# Patient Record
Sex: Male | Born: 1995 | Race: Black or African American | Hispanic: No | Marital: Single | Smoking: Current some day smoker
Health system: Southern US, Community
[De-identification: ages and names within clinical notes are randomized; demographics above are authoritative.]

## PROBLEM LIST (undated history)

## (undated) DIAGNOSIS — M419 Scoliosis, unspecified: Secondary | ICD-10-CM

## (undated) HISTORY — PX: HERNIA REPAIR: SHX51

---
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
## 2016-07-19 IMAGING — CT CT HEAD W/O CM
2 series · 17 of 30 positions shown, 20 images · non-contrast
Comparison: None.

CLINICAL DATA: Frontal headache, nausea

EXAM:
CT HEAD WITHOUT CONTRAST
TECHNIQUE: Contiguous axial images were obtained from the base of the skull
through the vertex without intravenous contrast.

[Series 2: head w/o · axial · non-contrast · 0.45mm/px · z∈[+689,+804]mm · 9 of 29 slices shown, 12 images]
[im 3/29  brain]
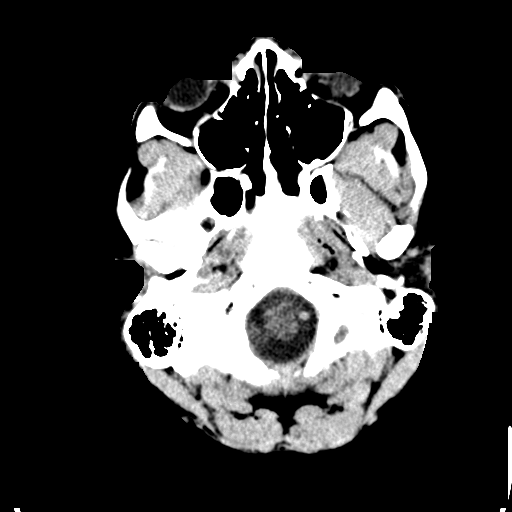
[im 3/29  bone]
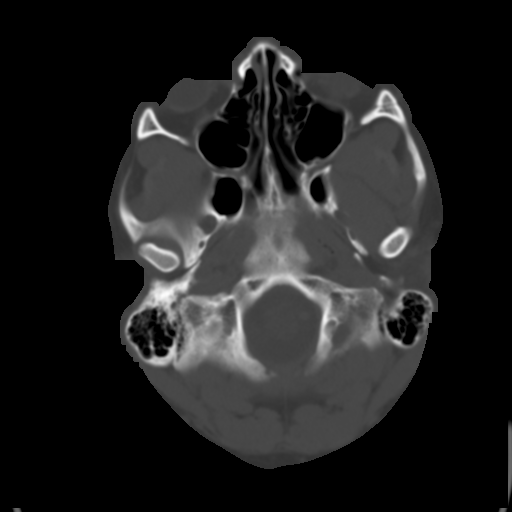
[im 6/29  brain]
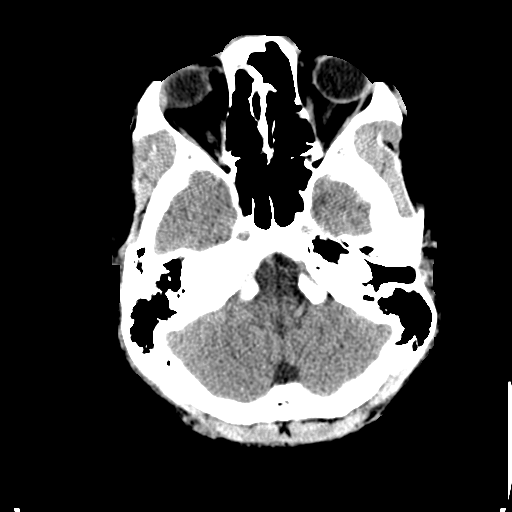
[im 9/29  brain]
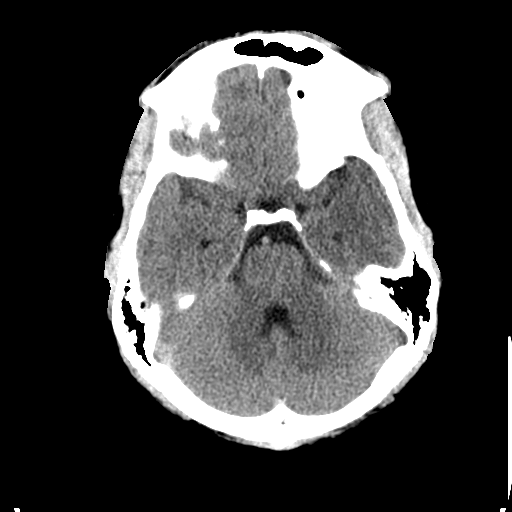
[im 12/29  brain]
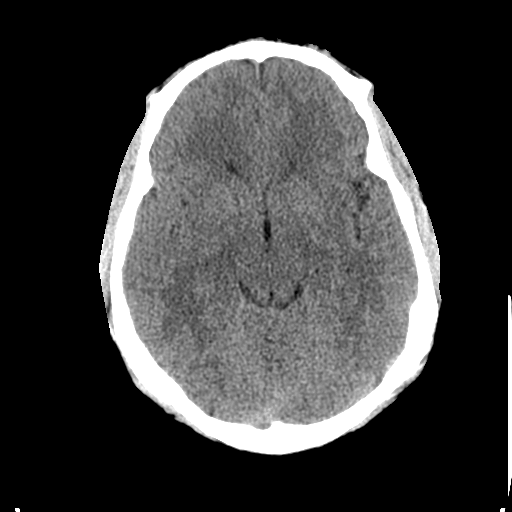
[im 15/29  brain]
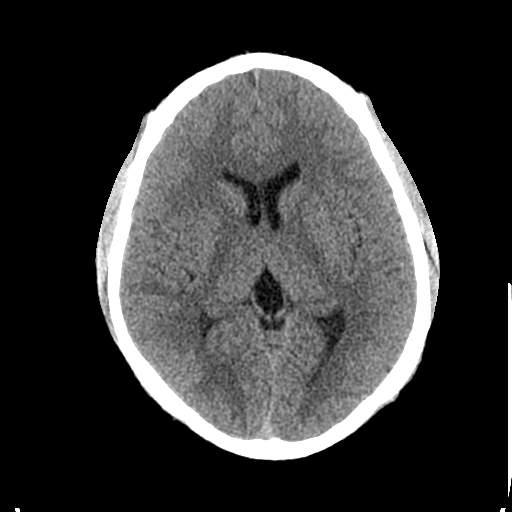
[im 15/29  bone]
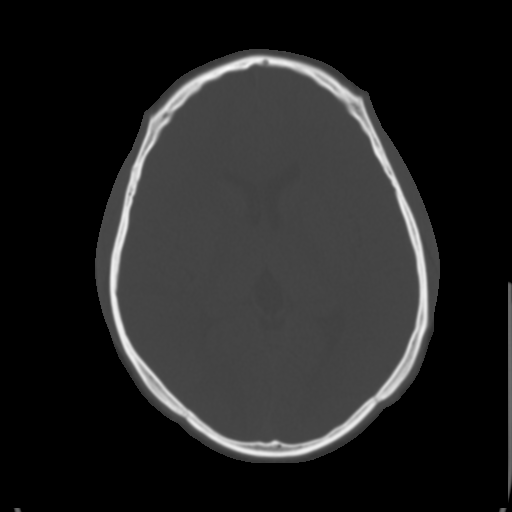
[im 17/29  brain]
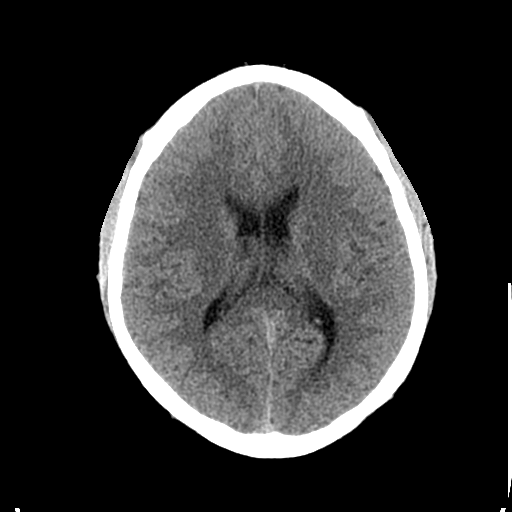
[im 20/29  brain]
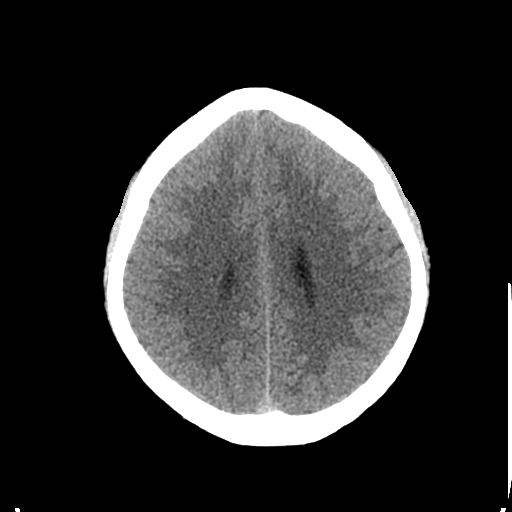
[im 23/29  brain]
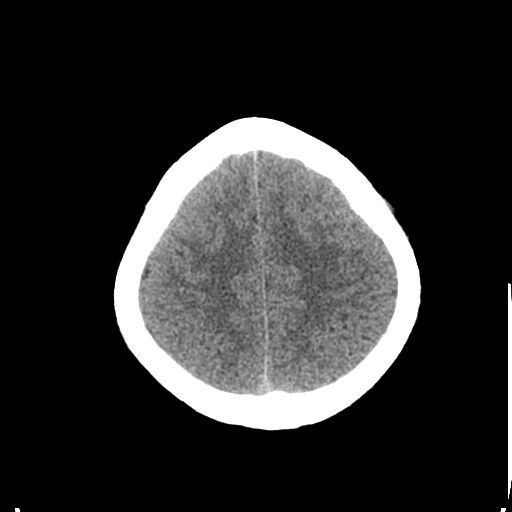
[im 26/29  brain]
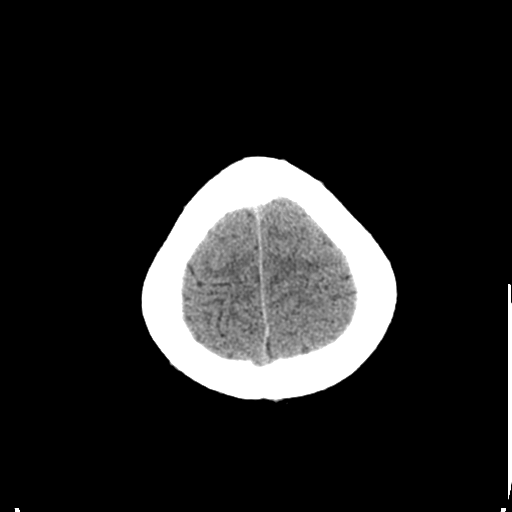
[im 26/29  bone]
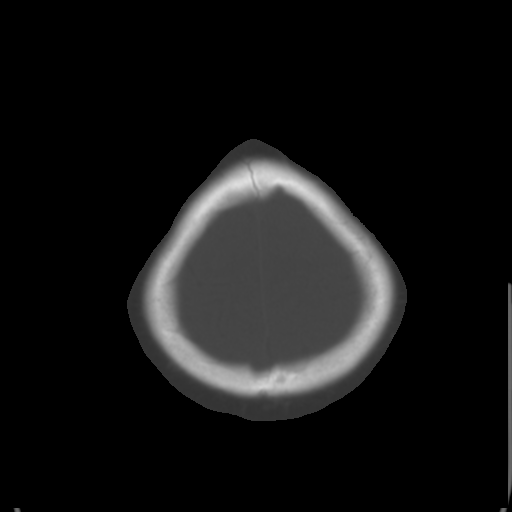

[Series 3: bone windows · axial · 0.45mm/px · z∈[+694,+805]mm · 8 of 49 slices shown]
[im 6/49  bone]
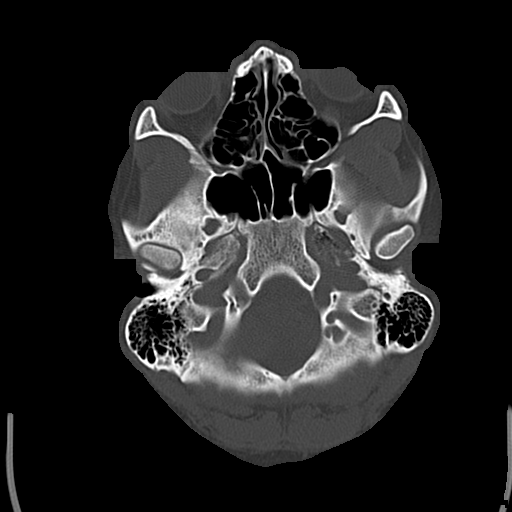
[im 11/49  bone]
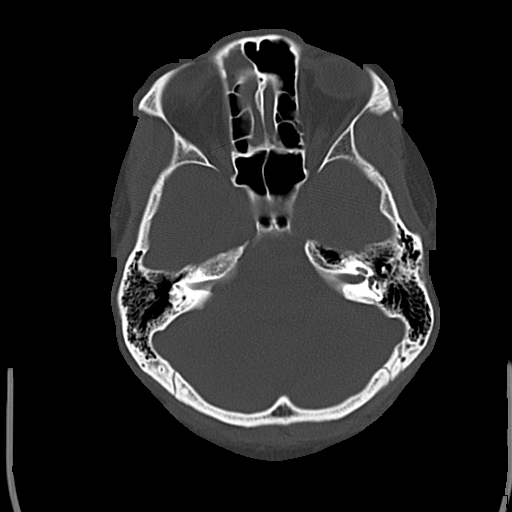
[im 17/49  bone]
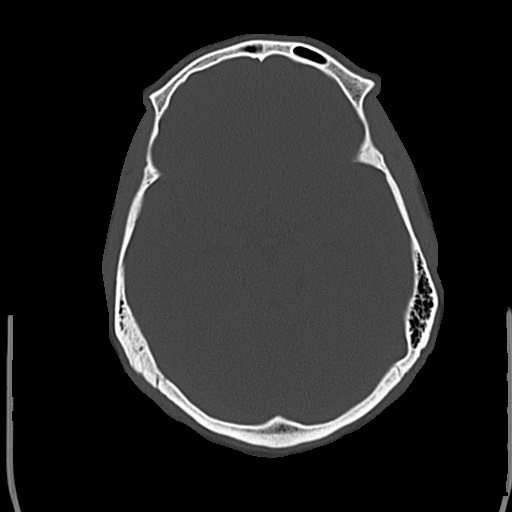
[im 22/49  bone]
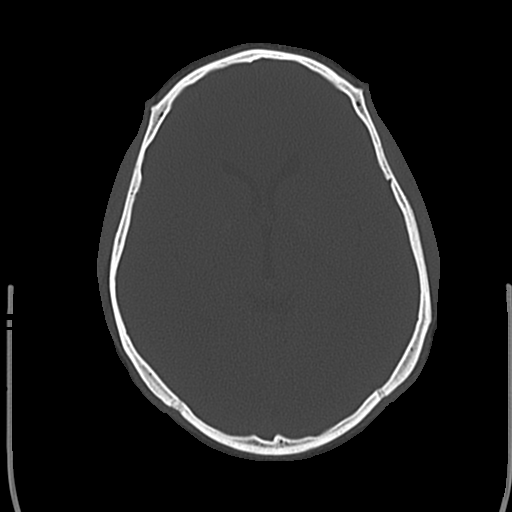
[im 27/49  bone]
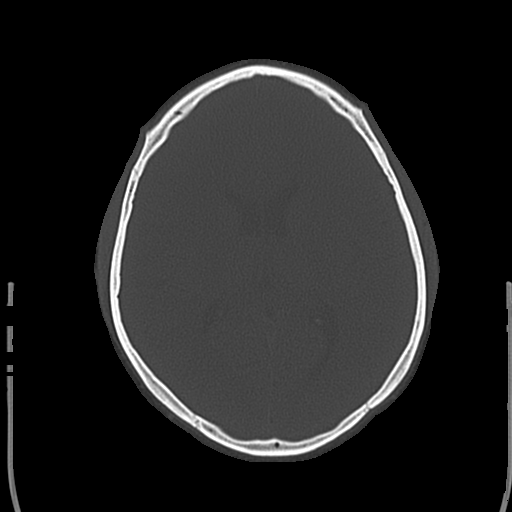
[im 33/49  bone]
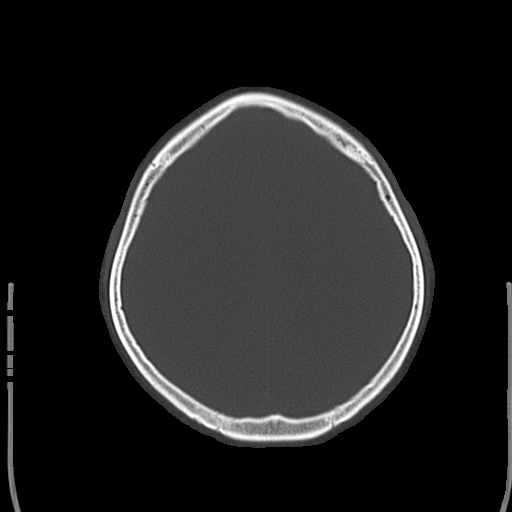
[im 38/49  bone]
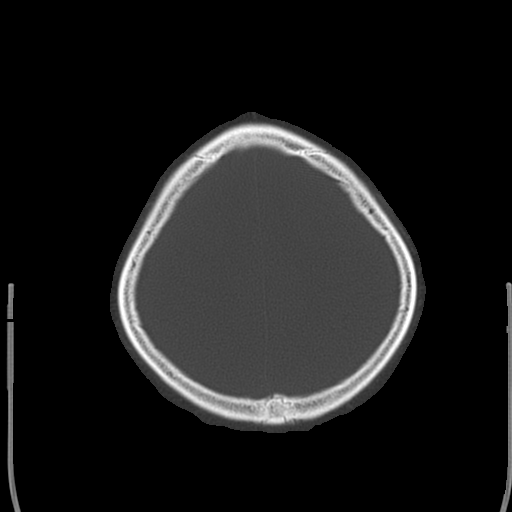
[im 43/49  bone]
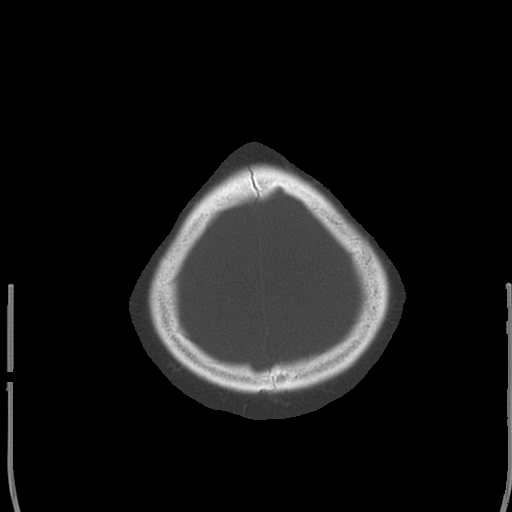

[17 of 30 positions shown; findings below may reference images not displayed]

FINDINGS: No evidence of parenchymal hemorrhage or extra-axial fluid
collection. No mass lesion, mass effect, or midline shift.

No CT evidence of acute infarction.

Cerebral volume is within normal limits.  No ventriculomegaly.

Partial opacification of the right frontal sinus. Visualized
paranasal sinuses are otherwise clear. The mastoid air cells are
unopacified.

No evidence of calvarial fracture.
IMPRESSION: Normal head CT.

## 2018-07-12 ENCOUNTER — Encounter: Payer: Self-pay | Admitting: Internal Medicine

## 2020-06-10 ENCOUNTER — Emergency Department (HOSPITAL_COMMUNITY): Payer: Self-pay

## 2020-06-10 ENCOUNTER — Encounter (HOSPITAL_COMMUNITY): Payer: Self-pay | Admitting: *Deleted

## 2020-06-10 ENCOUNTER — Emergency Department (HOSPITAL_COMMUNITY)
Admission: EM | Admit: 2020-06-10 | Discharge: 2020-06-10 | Disposition: A | Discharge status: Home / Self Care | Payer: Self-pay | Attending: Emergency Medicine | Admitting: Emergency Medicine

## 2020-06-10 ENCOUNTER — Other Ambulatory Visit: Payer: Self-pay

## 2020-06-10 DIAGNOSIS — Z7951 Long term (current) use of inhaled steroids: Secondary | ICD-10-CM | POA: Insufficient documentation

## 2020-06-10 DIAGNOSIS — S6291XA Unspecified fracture of right wrist and hand, initial encounter for closed fracture: Secondary | ICD-10-CM

## 2020-06-10 DIAGNOSIS — Z23 Encounter for immunization: Secondary | ICD-10-CM | POA: Insufficient documentation

## 2020-06-10 DIAGNOSIS — M79641 Pain in right hand: Secondary | ICD-10-CM | POA: Insufficient documentation

## 2020-06-10 DIAGNOSIS — J45909 Unspecified asthma, uncomplicated: Secondary | ICD-10-CM | POA: Insufficient documentation

## 2020-06-10 MED ORDER — TETANUS-DIPHTH-ACELL PERTUSSIS 5-2.5-18.5 LF-MCG/0.5 IM SUSP
0.5000 mL | Freq: Once | INTRAMUSCULAR | Status: AC
  Administered 2020-06-10: 0.5 mL via INTRAMUSCULAR
  Filled 2020-06-10: qty 0.5

## 2020-06-10 NOTE — Progress Notes (Signed)
Orthopedic Tech Progress Note Patient Details:  Mitchell Wilson 01-23-1996 [id number]  Ortho Devices Type of Ortho Device: Ace wrap, Ulna gutter splint Ortho Device/Splint Location: RUE Ortho Device/Splint Interventions: Ordered, Application   Post Interventions Patient Tolerated: Well Instructions Provided: Care of device   [name] 06/10/2020, 11:05 PM
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
## 2020-07-19 ENCOUNTER — Encounter (HOSPITAL_COMMUNITY): Payer: Self-pay

## 2020-07-19 ENCOUNTER — Emergency Department (HOSPITAL_COMMUNITY): Payer: Self-pay

## 2020-07-19 ENCOUNTER — Emergency Department (HOSPITAL_COMMUNITY)
Admission: EM | Admit: 2020-07-19 | Discharge: 2020-07-20 | Disposition: A | Discharge status: Home / Self Care | Payer: Self-pay | Attending: Emergency Medicine | Admitting: Emergency Medicine

## 2020-07-19 DIAGNOSIS — Y9389 Activity, other specified: Secondary | ICD-10-CM | POA: Insufficient documentation

## 2020-07-19 DIAGNOSIS — Y9259 Other trade areas as the place of occurrence of the external cause: Secondary | ICD-10-CM | POA: Insufficient documentation

## 2020-07-19 DIAGNOSIS — W228XXA Striking against or struck by other objects, initial encounter: Secondary | ICD-10-CM | POA: Insufficient documentation

## 2020-07-19 DIAGNOSIS — Z5321 Procedure and treatment not carried out due to patient leaving prior to being seen by health care provider: Secondary | ICD-10-CM | POA: Insufficient documentation

## 2020-07-19 DIAGNOSIS — S61411A Laceration without foreign body of right hand, initial encounter: Secondary | ICD-10-CM | POA: Insufficient documentation

## 2020-07-19 DIAGNOSIS — S61412A Laceration without foreign body of left hand, initial encounter: Secondary | ICD-10-CM | POA: Insufficient documentation
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
## 2020-11-22 ENCOUNTER — Ambulatory Visit (HOSPITAL_COMMUNITY)
Admission: EM | Admit: 2020-11-22 | Discharge: 2020-11-22 | Disposition: A | Discharge status: Home / Self Care | Payer: No Payment, Other | Attending: Psychiatry | Admitting: Psychiatry

## 2020-11-22 ENCOUNTER — Other Ambulatory Visit: Payer: Self-pay

## 2020-11-22 DIAGNOSIS — Z733 Stress, not elsewhere classified: Secondary | ICD-10-CM | POA: Diagnosis not present

## 2020-11-22 DIAGNOSIS — R455 Hostility: Secondary | ICD-10-CM | POA: Diagnosis not present

## 2020-11-22 DIAGNOSIS — F39 Unspecified mood [affective] disorder: Secondary | ICD-10-CM | POA: Insufficient documentation

## 2020-11-22 DIAGNOSIS — Z653 Problems related to other legal circumstances: Secondary | ICD-10-CM | POA: Insufficient documentation

## 2020-11-22 DIAGNOSIS — Z818 Family history of other mental and behavioral disorders: Secondary | ICD-10-CM | POA: Insufficient documentation

## 2020-11-22 DIAGNOSIS — J45909 Unspecified asthma, uncomplicated: Secondary | ICD-10-CM | POA: Insufficient documentation

## 2020-11-22 DIAGNOSIS — Z560 Unemployment, unspecified: Secondary | ICD-10-CM | POA: Diagnosis not present

## 2020-11-22 LAB — COMPREHENSIVE METABOLIC PANEL
ALT: 19 U/L (ref 0–44)
AST: 24 U/L (ref 15–41)
Albumin: 4.2 g/dL (ref 3.5–5.0)
Alkaline Phosphatase: 65 U/L (ref 38–126)
Anion gap: 11 (ref 5–15)
BUN: 7 mg/dL (ref 6–20)
CO2: 27 mmol/L (ref 22–32)
Calcium: 9.6 mg/dL (ref 8.9–10.3)
Chloride: 102 mmol/L (ref 98–111)
Creatinine, Ser: 1.05 mg/dL (ref 0.61–1.24)
GFR, Estimated: >60 mL/min (ref >60)
Glucose, Bld: 92 mg/dL (ref 70–99)
Potassium: 4 mmol/L (ref 3.5–5.1)
Sodium: 140 mmol/L (ref 135–145)
Total Bilirubin: 0.8 mg/dL (ref 0.3–1.2)
Total Protein: 7.7 g/dL (ref 6.5–8.1)

## 2020-11-22 LAB — CBC WITH DIFFERENTIAL/PLATELET
Abs Immature Granulocytes: 0.03 10*3/uL (ref 0.00–0.07)
Basophils Absolute: 0 10*3/uL (ref 0.0–0.1)
Basophils Relative: 1 %
Eosinophils Absolute: 0.1 10*3/uL (ref 0.0–0.5)
Eosinophils Relative: 2 %
HCT: 43.6 % (ref 39.0–52.0)
Hemoglobin: 13.8 g/dL (ref 13.0–17.0)
Immature Granulocytes: 1 %
Lymphocytes Relative: 37 %
Lymphs Abs: 2.1 10*3/uL (ref 0.7–4.0)
MCH: 27 pg (ref 26.0–34.0)
MCHC: 31.7 g/dL (ref 30.0–36.0)
MCV: 85.3 fL (ref 80.0–100.0)
Monocytes Absolute: 0.4 10*3/uL (ref 0.1–1.0)
Monocytes Relative: 8 %
Neutro Abs: 3 10*3/uL (ref 1.7–7.7)
Neutrophils Relative %: 51 %
Platelets: 212 10*3/uL (ref 150–400)
RBC: 5.11 MIL/uL (ref 4.22–5.81)
RDW: 14 % (ref 11.5–15.5)
WBC: 5.7 10*3/uL (ref 4.0–10.5)
nRBC: 0 % (ref 0.0–0.2)

## 2020-11-22 LAB — LIPID PANEL
Cholesterol: 205 mg/dL — ABNORMAL HIGH (ref 0–200)
HDL: 38 mg/dL — ABNORMAL LOW (ref >40)
LDL Cholesterol: 153 mg/dL — ABNORMAL HIGH (ref 0–99)
Total CHOL/HDL Ratio: 5.4 RATIO
Triglycerides: 71 mg/dL (ref <150)
VLDL: 14 mg/dL (ref 0–40)

## 2020-11-22 LAB — TSH: TSH: 1.382 u[IU]/mL (ref 0.350–4.500)

## 2020-11-22 LAB — HEMOGLOBIN A1C
Hgb A1c MFr Bld: 5.8 % — ABNORMAL HIGH (ref 4.8–5.6)
Mean Plasma Glucose: 119.76 mg/dL

## 2020-11-22 MED ORDER — QUETIAPINE FUMARATE 100 MG PO TABS: 100.0000 mg | ORAL_TABLET | Freq: Every day | ORAL | 1 refills | Status: DC

## 2020-11-22 MED ORDER — OXCARBAZEPINE 150 MG PO TABS: 150.0000 mg | ORAL_TABLET | Freq: Two times a day (BID) | ORAL | 1 refills | Status: DC

## 2020-11-22 NOTE — ED Provider Notes (Signed)
[facility] Medical Screening Exam  Patient Name: Mitchell Wilson MRN: [id number] Date of Evaluation: 11/22/20 Chief Complaint:   Diagnosis:  Final diagnoses:  Mood disorder (HCC)    History of Present illness: Mitchell Wilson is a 25 y.o. male.  Patient resents voluntarily as a walk-in to the [facility] accompanied by his mother. Mother is present  present for the evaluation.  Patient reports that he lives with his mother and he is unemployed and not in school.  He reports that he has aggressive behavior and that he does use marijuana.  He reports that his aggressive behavior gets out of control and that his moods are up and down and feels paranoid sometimes.  He reports that he is currently in mental health court and has 9 charges pending against him for various assault charges.  Patient denies any suicidal or homicidal ideations and denies any hallucinations.  He reports that the biggest reason he came in is he is wanting to receive follow-up treatment and to get started on some medications for his aggression.  Patient's mother reports that she is diagnosed with schizophrenia and they are concerned for his mental health.  She states that her daughter also has mental health issues but does not have a diagnosis yet.  She reports there were scheduled to see a provider at the [facility] for outpatient on 12/30/2020 but feels that his aggression has continued to worsen and that the judge about the mental health court recommended that he come here to be evaluated before that appointment.  Patient denies any medical issues except for having asthma and that he uses a inhaler as needed.  Patient denies having any other medical problems and is agreeable to have labs drawn today.  He also reports he has difficulty with sleeping.  After discussing medication options for the complaints agreed to start the patient on Seroquel 100 mg p.o. nightly and Trileptal 150 mg p.o. twice daily appropriate labs were  ordered.  Patient and patient's mother feel that he is safe for discharge home with the medications and knowing that have a follow-up appointment on 12/30/2020.  Is also recommended to return for open access for walk-in at the [facility]  Psychiatric Specialty Exam  Presentation  General Appearance:Appropriate for Environment; Casual; Well Groomed  Eye Contact:Good  Speech:Clear and Coherent; Normal Rate  Speech Volume:Normal  Handedness:Right   Mood and Affect  Mood:Euthymic  Affect:Appropriate; Congruent   Thought Process  Thought Processes:Coherent  Descriptions of Associations:Intact  Orientation:Full (Time, Place and Person)  Thought Content:WDL  Hallucinations:None  Ideas of Reference:Paranoia  Suicidal Thoughts:No  Homicidal Thoughts:No data recorded  Sensorium  Memory:Immediate Good; Recent Good; Remote Good  Judgment:Intact  Insight:Fair   Executive Functions  Concentration:Good  Attention Span:Good  Recall:Good  Fund of Knowledge:Good  Language:Good   Psychomotor Activity  Psychomotor Activity:Normal   Assets  Assets:Communication Skills; Desire for Improvement; Housing; Social Support; Transportation; Physical Health   Sleep  Sleep:Fair  Number of hours: No data recorded  Physical Exam: Physical Exam Vitals and nursing note reviewed.  Constitutional:      Appearance: He is well-developed.  HENT:     Head: Normocephalic.  Eyes:     Pupils: Pupils are equal, round, and reactive to light.  Cardiovascular:     Rate and Rhythm: Normal rate.  Pulmonary:     Effort: Pulmonary effort is normal.  Musculoskeletal:        General: Normal range of motion.  Neurological:  Mental Status: He is alert and oriented to person, place, and time.    Review of Systems  Constitutional: Negative.   HENT: Negative.   Eyes: Negative.   Respiratory: Negative.   Cardiovascular: Negative.   Gastrointestinal: Negative.   Genitourinary:  Negative.   Musculoskeletal: Negative.   Skin: Negative.   Neurological: Negative.   Endo/Heme/Allergies: Negative.   Psychiatric/Behavioral: Negative.    Blood pressure 128/82, pulse 80, temperature 98 F (36.7 C), temperature source Oral, resp. rate 18, SpO2 100 %. There is no height or weight on file to calculate BMI.  Musculoskeletal: Strength & Muscle Tone: within normal limits Gait & Station: normal Patient leans: N/A   [facility] MSE Discharge Disposition for Follow up and Recommendations: Based on my evaluation the patient does not appear to have an emergency medical condition and can be discharged with resources and follow up care in outpatient services for Medication Management and Individual Therapy   Maryfrances Bunnell, FNP 11/22/2020, 10:21 AM
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
## 2020-11-22 NOTE — Discharge Instructions (Signed)
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
## 2020-11-22 NOTE — Discharge Summary (Signed)
[name] to be D/C'd home per MD order. Discussed with the patient and all questions fully answered. An After Visit Summary was printed and given to the patient. Prescriptions were also given to patient. Patient escorted out and D/C home via private auto.  Dickie La  11/22/2020 10:56 AM

## 2020-12-02 ENCOUNTER — Telehealth (HOSPITAL_COMMUNITY): Payer: Self-pay | Admitting: General Practice

## 2020-12-02 NOTE — Telephone Encounter (Signed)
Care Management - Follow Up [facility] Discharges   Writer attempted to make contact with patient today and was unsuccessful.  Writer was able to leave a HIPPA compliant voice message and will await callback.  Per chart review, patient has a follow up appointment on 12-30-2020.

## 2020-12-30 ENCOUNTER — Other Ambulatory Visit: Payer: Self-pay

## 2020-12-30 ENCOUNTER — Other Ambulatory Visit (HOSPITAL_COMMUNITY): Payer: Self-pay | Admitting: Psychiatry

## 2020-12-30 ENCOUNTER — Ambulatory Visit (HOSPITAL_COMMUNITY): Payer: No Payment, Other | Admitting: Clinical

## 2020-12-30 ENCOUNTER — Other Ambulatory Visit: Payer: Self-pay | Admitting: Psychiatry

## 2020-12-30 ENCOUNTER — Ambulatory Visit (INDEPENDENT_AMBULATORY_CARE_PROVIDER_SITE_OTHER): Payer: No Payment, Other | Admitting: Psychiatry

## 2020-12-30 ENCOUNTER — Encounter (HOSPITAL_COMMUNITY): Payer: Self-pay | Admitting: Psychiatry

## 2020-12-30 DIAGNOSIS — F17200 Nicotine dependence, unspecified, uncomplicated: Secondary | ICD-10-CM

## 2020-12-30 DIAGNOSIS — F3130 Bipolar disorder, current episode depressed, mild or moderate severity, unspecified: Secondary | ICD-10-CM | POA: Diagnosis not present

## 2020-12-30 DIAGNOSIS — F411 Generalized anxiety disorder: Secondary | ICD-10-CM | POA: Diagnosis not present

## 2020-12-30 MED ORDER — HYDROXYZINE HCL 10 MG PO TABS: 10.0000 mg | ORAL_TABLET | Freq: Three times a day (TID) | ORAL | 2 refills | Status: DC | PRN

## 2020-12-30 MED ORDER — QUETIAPINE FUMARATE 100 MG PO TABS: 100.0000 mg | ORAL_TABLET | Freq: Every day | ORAL | 1 refills | Status: DC

## 2020-12-30 MED ORDER — BUPROPION HCL ER (XL) 150 MG PO TB24: 150.0000 mg | ORAL_TABLET | ORAL | 2 refills | Status: AC

## 2020-12-30 MED ORDER — OXCARBAZEPINE 150 MG PO TABS: 150.0000 mg | ORAL_TABLET | Freq: Two times a day (BID) | ORAL | 2 refills | Status: AC

## 2020-12-30 MED ORDER — BUPROPION HCL ER (XL) 150 MG PO TB24: 150.0000 mg | ORAL_TABLET | ORAL | 2 refills | Status: DC

## 2020-12-30 MED ORDER — OXCARBAZEPINE 150 MG PO TABS: 150.0000 mg | ORAL_TABLET | Freq: Two times a day (BID) | ORAL | 2 refills | Status: DC

## 2020-12-30 NOTE — Progress Notes (Signed)
Psychiatric Initial Adult Assessment   Patient Identification: Mitchell Wilson MRN:  [id number] Date of Evaluation:  12/30/2020 Referral Source: [facility] Chief Complaint:  "I get angry and anxious. I get triggered easily" Visit Diagnosis:    ICD-10-CM   1. Bipolar I disorder, most recent episode depressed (HCC)  F31.30 QUEtiapine (SEROQUEL) 100 MG tablet    OXcarbazepine (TRILEPTAL) 150 MG tablet    buPROPion (WELLBUTRIN XL) 150 MG 24 hr tablet  2. Generalized anxiety disorder  F41.1 hydrOXYzine (ATARAX/VISTARIL) 10 MG tablet  3. Tobacco use disorder  F17.200 buPROPion (WELLBUTRIN XL) 150 MG 24 hr tablet    History of Present Illness: 25 year old male seen today for initial psychiatric evaluation.  He was referred to outpatient psychiatry by [facility] for medication management.  He has a psychiatric history of ADHD and bipolar disorder.  He is currently being managed on Trileptal 150 mg twice daily and Seroquel 100 mg nightly.  He notes his medications are somewhat effective in managing his psychiatric conditions.  Today he is well-groomed, pleasant, cooperative, engaged in conversation, and maintained eye contact.  He informed provider that he becomes angry and anxious when triggered by disrespectful people.  He notes that this happens often and reports that he snaps on others.  He endorses symptoms of hypomania such as distractibility, fluctuations in mood, racing thoughts, grandiosity (noting at times he has a God complex), and irritability.  Patient notes that when he is irritated he becomes anxious.  Provider conducted a GAD-7 and patient scored a 13.  He notes that to cope with his anxiety he smokes a pack of cigarettes a day but notes that he would like to stop.  He notes that since February he has been sober from marijuana and alcohol.  Provider also conducted a PHQ-9 and patient scored a 15.  He endorses depressed mood, psychomotor agitation, feelings of worthlessness, poor concentration,  fluctuations in weight and fluctuations in appetite.  He endorses adequate sleep while on Seroquel.  Today he denies SI/HI/VAH or paranoia.  Patient informed provider that he has 9 pending court cases due to his mental health conditions.  He informed Clinical research associate that he attends regular classes that will help reduce some of his charges.  He notes that he dislikes the program however is committed to completing it.  Patient notes that he exercises at the gym daily to help manage his stress.  He notes that exercising also helps his mental health.  Today he is agreeable to starting Wellbutrin 150 mg daily to help manage symptoms of depression.  He will also start hydroxyzine 10 mg 3 times daily as needed for anxiety. Potential side effects of medication and risks vs benefits of treatment vs non-treatment were explained and discussed. All questions were answered. He will continue all other medications as prescribed.  No other concerns noted at this time.  Patient notes that Associated Signs/Symptoms: Depression Symptoms:  depressed mood, psychomotor agitation, fatigue, feelings of worthlessness/guilt, difficulty concentrating, hopelessness, impaired memory, anxiety, increased appetite, decreased appetite, (Hypo) Manic Symptoms:  Distractibility, Elevated Mood, Flight of Ideas, Grandiosity, Irritable Mood, Anxiety Symptoms:  Excessive Worry, Psychotic Symptoms:  Denies PTSD Symptoms: NA  Past Psychiatric History: ADHD, Bipolar disorder,  Previous Psychotropic Medications: Seroquel, Trileptal  Substance Abuse History in the last 12 months:  Yes.    Consequences of Substance Abuse: Legal Consequences:  Has 9 pending court cases  Past Medical History:  Past Medical History:  Diagnosis Date  . Asthma   . Scoliosis  Past Surgical History:  Procedure Laterality Date  . HERNIA REPAIR      Family Psychiatric History: Mother schizophrenia and bipolar, Brother Bipolar disorder, 2 sisters  Bipolar disorder  Family History: History reviewed. No pertinent family history.  Social History:   Social History   Socioeconomic History  . Marital status: Single    Spouse name: Not on file  . Number of children: Not on file  . Years of education: Not on file  . Highest education level: Not on file  Occupational History  . Not on file  Tobacco Use  . Smoking status: Never Smoker  . Smokeless tobacco: Not on file  Substance and Sexual Activity  . Alcohol use: No  . Drug use: No  . Sexual activity: Not on file  Other Topics Concern  . Not on file  Social History Narrative  . Not on file   Social Determinants of Health   Financial Resource Strain: Not on file  Food Insecurity: Not on file  Transportation Needs: Not on file  Physical Activity: Not on file  Stress: Not on file  Social Connections: Not on file    Additional Social History: Patient resides in [place] with his parents. She is single and has no children. Current he is unemployed. He endorses smoking a pack of cigarettes a day. He notes that he has been sober from marijuana and alcohol for two months  Allergies:  No Known Allergies  Metabolic Disorder Labs: Lab Results  Component Value Date   HGBA1C 5.8 (H) 11/22/2020   MPG 119.76 11/22/2020   No results found for: PROLACTIN Lab Results  Component Value Date   CHOL 205 (H) 11/22/2020   TRIG 71 11/22/2020   HDL 38 (L) 11/22/2020   CHOLHDL 5.4 11/22/2020   VLDL 14 11/22/2020   LDLCALC 153 (H) 11/22/2020   Lab Results  Component Value Date   TSH 1.382 11/22/2020    Therapeutic Level Labs: No results found for: LITHIUM No results found for: CBMZ No results found for: VALPROATE  Current Medications: Current Outpatient Medications  Medication Sig Dispense Refill  . buPROPion (WELLBUTRIN XL) 150 MG 24 hr tablet Take 1 tablet (150 mg total) by mouth every morning. 30 tablet 2  . hydrOXYzine (ATARAX/VISTARIL) 10 MG tablet Take 1 tablet (10 mg  total) by mouth 3 (three) times daily as needed. 90 tablet 2  . acetaminophen (TYLENOL) 325 MG tablet Take 650 mg by mouth every 6 (six) hours as needed.      Marland Kitchen albuterol (PROVENTIL HFA;VENTOLIN HFA) 108 (90 BASE) MCG/ACT inhaler Inhale 1 puff into the lungs every 6 (six) hours as needed for wheezing or shortness of breath.    . ALBUTEROL IN Inhale into the lungs.      . beclomethasone (QVAR) 80 MCG/ACT inhaler Inhale 1 puff into the lungs as needed.      . loratadine (CLARITIN) 10 MG tablet Take 1 tablet (10 mg total) by mouth daily. 14 tablet 0  . naproxen (NAPROSYN) 500 MG tablet Take 1 tablet (500 mg total) by mouth 2 (two) times daily. 30 tablet 0  . OXcarbazepine (TRILEPTAL) 150 MG tablet Take 1 tablet (150 mg total) by mouth 2 (two) times daily. 60 tablet 2  . QUEtiapine (SEROQUEL) 100 MG tablet Take 1 tablet (100 mg total) by mouth at bedtime. 30 tablet 1  . triamcinolone (NASACORT AQ) 55 MCG/ACT AERO nasal inhaler Place 2 sprays into the nose daily. 1 Inhaler 12   No current facility-administered  medications for this visit.    Musculoskeletal: Strength & Muscle Tone: within normal limits Gait & Station: normal Patient leans: N/A  Psychiatric Specialty Exam: Review of Systems  There were no vitals taken for this visit.There is no height or weight on file to calculate BMI.  General Appearance: Well Groomed  Eye Contact:  Good  Speech:  Clear and Coherent and Normal Rate  Volume:  Normal  Mood:  Anxious and Depressed  Affect:  Appropriate and Congruent  Thought Process:  Coherent, Goal Directed and Linear  Orientation:  Full (Time, Place, and Person)  Thought Content:  WDL and Logical  Suicidal Thoughts:  No  Homicidal Thoughts:  No  Memory:  Immediate;   Good Recent;   Good Remote;   Good  Judgement:  Good  Insight:  Good  Psychomotor Activity:  Normal  Concentration:  Concentration: Good and Attention Span: Good  Recall:  Good  Fund of Knowledge:Good  Language: Good   Akathisia:  No  Handed:  Left  AIMS (if indicated):  Not done  Assets:  Communication Skills Desire for Improvement Housing Social Support  ADL's:  Intact  Cognition: WNL  Sleep:  Good   Screenings: GAD-7   Flowsheet Row Office Visit from 12/30/2020 in [facility]  Total GAD-7 Score 13    PHQ2-9   Flowsheet Row Office Visit from 12/30/2020 in [facility]  PHQ-2 Total Score 3  PHQ-9 Total Score 15    Flowsheet Row Office Visit from 12/30/2020 in [facility]  C-SSRS RISK CATEGORY Error: Q7 should not be populated when Q6 is No      Assessment and Plan: Patient endorses symptoms of anxiety, depression, and hypomania.Today he is agreeable to starting Wellbutrin 150 mg daily to help manage symptoms of depression.  He will also start hydroxyzine 10 mg 3 times daily as needed for anxiety.  He will continue all other medications as prescribed.  1. Bipolar I disorder, most recent episode depressed (HCC)  Continue- QUEtiapine (SEROQUEL) 100 MG tablet; Take 1 tablet (100 mg total) by mouth at bedtime.  Dispense: 30 tablet; Refill: 1 Continue- OXcarbazepine (TRILEPTAL) 150 MG tablet; Take 1 tablet (150 mg total) by mouth 2 (two) times daily.  Dispense: 60 tablet; Refill: 2 Start- buPROPion (WELLBUTRIN XL) 150 MG 24 hr tablet; Take 1 tablet (150 mg total) by mouth every morning.  Dispense: 30 tablet; Refill: 2  2. Generalized anxiety disorder  Start- hydrOXYzine (ATARAX/VISTARIL) 10 MG tablet; Take 1 tablet (10 mg total) by mouth 3 (three) times daily as needed.  Dispense: 90 tablet; Refill: 2  3. Tobacco use disorder  Start- buPROPion (WELLBUTRIN XL) 150 MG 24 hr tablet; Take 1 tablet (150 mg total) by mouth every morning.  Dispense: 30 tablet; Refill: 2  Follow-up in 3 months   Shanna Cisco, NP 3/31/202210:17 AM

## 2021-01-01 ENCOUNTER — Other Ambulatory Visit: Payer: Self-pay

## 2021-01-01 MED FILL — Bupropion HCl Tab ER 24HR 150 MG: ORAL | 30 days supply | Qty: 30 | Fill #0 | Status: AC

## 2021-01-01 MED FILL — Quetiapine Fumarate Tab 100 MG: ORAL | 30 days supply | Qty: 30 | Fill #0 | Status: AC

## 2021-01-01 MED FILL — Hydroxyzine HCl Tab 10 MG: ORAL | 30 days supply | Qty: 90 | Fill #0 | Status: AC

## 2021-01-01 MED FILL — Oxcarbazepine Tab 150 MG: ORAL | 30 days supply | Qty: 60 | Fill #0 | Status: AC

## 2021-01-02 ENCOUNTER — Other Ambulatory Visit: Payer: Self-pay

## 2021-01-03 ENCOUNTER — Other Ambulatory Visit: Payer: Self-pay

## 2021-01-04 ENCOUNTER — Other Ambulatory Visit: Payer: Self-pay

## 2021-03-24 ENCOUNTER — Telehealth (HOSPITAL_COMMUNITY): Payer: No Payment, Other | Admitting: Psychiatry

## 2021-05-12 ENCOUNTER — Other Ambulatory Visit: Payer: Self-pay
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
## 2023-02-07 ENCOUNTER — Other Ambulatory Visit: Payer: Self-pay

## 2023-02-07 ENCOUNTER — Telehealth (HOSPITAL_COMMUNITY): Payer: Self-pay

## 2023-03-22 ENCOUNTER — Ambulatory Visit (INDEPENDENT_AMBULATORY_CARE_PROVIDER_SITE_OTHER): Payer: Medicaid Other | Admitting: Clinical

## 2023-03-22 ENCOUNTER — Encounter (HOSPITAL_COMMUNITY): Payer: Self-pay

## 2023-03-22 DIAGNOSIS — F3130 Bipolar disorder, current episode depressed, mild or moderate severity, unspecified: Secondary | ICD-10-CM | POA: Diagnosis not present
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
## 2023-03-30 ENCOUNTER — Ambulatory Visit (INDEPENDENT_AMBULATORY_CARE_PROVIDER_SITE_OTHER): Payer: Medicaid Other | Admitting: Psychiatry

## 2023-03-30 ENCOUNTER — Encounter (HOSPITAL_COMMUNITY): Payer: Self-pay | Admitting: Psychiatry

## 2023-03-30 DIAGNOSIS — F3130 Bipolar disorder, current episode depressed, mild or moderate severity, unspecified: Secondary | ICD-10-CM

## 2023-03-30 DIAGNOSIS — F411 Generalized anxiety disorder: Secondary | ICD-10-CM | POA: Diagnosis not present

## 2023-03-30 MED ORDER — ARIPIPRAZOLE 10 MG PO TABS: 10.0000 mg | ORAL_TABLET | Freq: Every day | ORAL | 3 refills | Status: DC

## 2023-03-30 MED ORDER — HYDROXYZINE HCL 10 MG PO TABS: 10.0000 mg | ORAL_TABLET | Freq: Three times a day (TID) | ORAL | 3 refills | Status: DC | PRN

## 2023-03-30 MED ORDER — TRAZODONE HCL 50 MG PO TABS: 50.0000 mg | ORAL_TABLET | Freq: Every evening | ORAL | 3 refills | Status: DC | PRN

## 2023-03-30 NOTE — Progress Notes (Signed)
Psychiatric Initial Adult Assessment  Virtual Visit via Video Note  I connected with Mitchell Wilson on 03/30/23 at  8:00 AM EDT by a video enabled telemedicine application and verified that I am speaking with the correct person using two identifiers.  Location: Patient: Home Provider: Clinic   I discussed the limitations of evaluation and management by telemedicine and the availability of in person appointments. The patient expressed understanding and agreed to proceed.  I provided 45 minutes of non-face-to-face time during this encounter.    Patient Identification: Mitchell Wilson MRN:  [id number] Date of Evaluation:  03/30/2023 Referral Source: [facility] Chief Complaint:  "I was incarcerated" Visit Diagnosis:    ICD-10-CM   1. Bipolar I disorder, most recent episode depressed (HCC)  F31.30 ARIPiprazole (ABILIFY) 10 MG tablet    traZODone (DESYREL) 50 MG tablet    2. Generalized anxiety disorder  F41.1 hydrOXYzine (ATARAX) 10 MG tablet    traZODone (DESYREL) 50 MG tablet      History of Present Illness: 27 year old male seen today for initial psychiatric evaluation.  He was recently incarcerated and released in April. He was was started on Abily 5  He has a psychiatric history of ADHD and bipolar disorder.  He is currently being managed on Trileptal 150 mg twice daily and Seroquel 100 mg nightly.  He notes his medications are somewhat effective in managing his psychiatric conditions.  Today he is well-groomed, pleasant, cooperative, engaged in conversation, and maintained eye contact. Patient informed provider that he is on probation and is concerned about it. He reports that he was incarcerated for 13 months for assault. He notes that while incarcerated he experienced he experienced trauma. He endorses flashbacks but denies nightmares or avoidant behavior. Since his release for jail he notes that he has been more anxious and depressed. Today provider conducted a GAD 7 and patient  scored an 18. Provider also conducted a PHQ and patient scored a 22. He notes that his appetite has been poor noting that he has lost 10 pounds. He also notes that his sleep has been poor reporting that he sleeps 3-4 hours nightly. Today he endorses passive SI but denies wanting to harm himself. Today he denies SI/HI/VAH or paranoia. Patient does endorse symptoms of hypomania. He reports that he is irritable, distracted, has racing thoughts (stating my mind is active), and fluctuations in mood.   Today he is agreeable to starting  hydroxyzine 10 mg 3 times daily as needed for anxiety.  He is also agreeable starting Trazodone 50 mg nightly as needed to help manage sleep. Abilify increased to 10 mg to help manage mood. Potential side effects of medication and risks vs benefits of treatment vs non-treatment were explained and discussed. All questions were answered. No other concerns noted at this time.   Patient notes that Associated Signs/Symptoms: Depression Symptoms:  depressed mood, psychomotor agitation, fatigue, feelings of worthlessness/guilt, difficulty concentrating, hopelessness, impaired memory, anxiety, increased appetite, decreased appetite, (Hypo) Manic Symptoms:  Distractibility, Elevated Mood, Flight of Ideas, Irritable Mood, Anxiety Symptoms:  Excessive Worry, Psychotic Symptoms:   Denies PTSD Symptoms: Had a traumatic exposure:  Reports that while incarcerated he experienced trauma  Past Psychiatric History: ADHD, Bipolar disorder,  Previous Psychotropic Medications:  Seroquel, Trileptal, Abilify, Wellbutrin, hydroxyzine  Substance Abuse History in the last 12 months:  Yes.    Consequences of Substance Abuse: Legal Consequences:  Currently on probation for assult  Past Medical History:  Past Medical History:  Diagnosis Date   Asthma  Scoliosis     Past Surgical History:  Procedure Laterality Date   HERNIA REPAIR      Family Psychiatric History: Mother  schizophrenia and bipolar, Brother Bipolar disorder, 2 sisters Bipolar disorder, report maternal family members have bipolar disorder  Family History: No family history on file.  Social History:   Social History   Socioeconomic History   Marital status: Single    Spouse name: Not on file   Number of children: Not on file   Years of education: Not on file   Highest education level: Not on file  Occupational History   Not on file  Tobacco Use   Smoking status: Never   Smokeless tobacco: Not on file  Substance and Sexual Activity   Alcohol use: No   Drug use: No   Sexual activity: Not on file  Other Topics Concern   Not on file  Social History Narrative   Not on file   Social Determinants of Health   Financial Resource Strain: Not on file  Food Insecurity: Not on file  Transportation Needs: Not on file  Physical Activity: Not on file  Stress: Not on file  Social Connections: Not on file    Additional Social History: Patient resides in [place] with his father. She is single and has no children. Current he is unemployed. He endorses reports that he has not smoked cigarettes in over a year. He notes that he uses marijuana every few days. He denies alcohol use.   Allergies:  No Known Allergies  Metabolic Disorder Labs: Lab Results  Component Value Date   HGBA1C 5.8 (H) 11/22/2020   MPG 119.76 11/22/2020   No results found for: "PROLACTIN" Lab Results  Component Value Date   CHOL 205 (H) 11/22/2020   TRIG 71 11/22/2020   HDL 38 (L) 11/22/2020   CHOLHDL 5.4 11/22/2020   VLDL 14 11/22/2020   LDLCALC 153 (H) 11/22/2020   Lab Results  Component Value Date   TSH 1.382 11/22/2020    Therapeutic Level Labs: No results found for: "LITHIUM" No results found for: "CBMZ" No results found for: "VALPROATE"  Current Medications: Current Outpatient Medications  Medication Sig Dispense Refill   ARIPiprazole (ABILIFY) 10 MG tablet Take 1 tablet (10 mg total) by mouth  daily. 30 tablet 3   traZODone (DESYREL) 50 MG tablet Take 1 tablet (50 mg total) by mouth at bedtime as needed for sleep. 30 tablet 3   acetaminophen (TYLENOL) 325 MG tablet Take 650 mg by mouth every 6 (six) hours as needed.       albuterol (PROVENTIL HFA;VENTOLIN HFA) 108 (90 BASE) MCG/ACT inhaler Inhale 1 puff into the lungs every 6 (six) hours as needed for wheezing or shortness of breath.     ALBUTEROL IN Inhale into the lungs.       beclomethasone (QVAR) 80 MCG/ACT inhaler Inhale 1 puff into the lungs as needed.       buPROPion (WELLBUTRIN XL) 150 MG 24 hr tablet Take 1 tablet (150 mg total) by mouth every morning. 30 tablet 2   buPROPion (WELLBUTRIN XL) 150 MG 24 hr tablet TAKE 1 TABLET (150 MG TOTAL) BY MOUTH EVERY MORNING. 30 tablet 2   hydrOXYzine (ATARAX) 10 MG tablet Take 1 tablet (10 mg total) by mouth 3 (three) times daily as needed. 90 tablet 3   loratadine (CLARITIN) 10 MG tablet Take 1 tablet (10 mg total) by mouth daily. 14 tablet 0   naproxen (NAPROSYN) 500 MG tablet Take 1  tablet (500 mg total) by mouth 2 (two) times daily. 30 tablet 0   OXcarbazepine (TRILEPTAL) 150 MG tablet Take 1 tablet (150 mg total) by mouth 2 (two) times daily. 60 tablet 2   OXcarbazepine (TRILEPTAL) 150 MG tablet TAKE 1 TABLET (150 MG TOTAL) BY MOUTH 2 (TWO) TIMES DAILY. 60 tablet 2   triamcinolone (NASACORT AQ) 55 MCG/ACT AERO nasal inhaler Place 2 sprays into the nose daily. 1 Inhaler 12   No current facility-administered medications for this visit.    Musculoskeletal: Strength & Muscle Tone: within normal limits and Telehealth visit Gait & Station: normal, Telehealth visit Patient leans: N/A  Psychiatric Specialty Exam: Review of Systems  There were no vitals taken for this visit.There is no height or weight on file to calculate BMI.  General Appearance: Well Groomed  Eye Contact:  Good  Speech:  Clear and Coherent and Normal Rate  Volume:  Normal  Mood:  Anxious and Depressed  Affect:   Appropriate and Congruent  Thought Process:  Coherent, Goal Directed and Linear  Orientation:  Full (Time, Place, and Person)  Thought Content:  WDL and Logical  Suicidal Thoughts:  No  Homicidal Thoughts:  No  Memory:  Immediate;   Good Recent;   Good Remote;   Good  Judgement:  Good  Insight:  Good  Psychomotor Activity:  Normal  Concentration:  Concentration: Good and Attention Span: Good  Recall:  Good  Fund of Knowledge:Good  Language: Good  Akathisia:  No  Handed:  Left  AIMS (if indicated):  Not done  Assets:  Communication Skills Desire for Improvement Housing Social Support  ADL's:  Intact  Cognition: WNL  Sleep:  Poor   Screenings: GAD-7    Flowsheet Row Office Visit from 03/30/2023 in [facility] Counselor from 03/22/2023 in [facility] Office Visit from 12/30/2020 in [facility]  Total GAD-7 Score 18 7 13       PHQ2-9    Flowsheet Row Office Visit from 03/30/2023 in [facility] Counselor from 03/22/2023 in [facility] Office Visit from 12/30/2020 in [facility]  PHQ-2 Total Score 6 6 3   PHQ-9 Total Score 22 19 15       Flowsheet Row Office Visit from 03/30/2023 in [facility] Office Visit from 12/30/2020 in [facility]  C-SSRS RISK CATEGORY Error: Q7 should not be populated when Q6 is No Error: Q7 should not be populated when Q6 is No       Assessment and Plan: Patient endorses symptoms of anxiety, depression, and hypomania.Today he is agreeable to starting  hydroxyzine 10 mg 3 times daily as needed for anxiety.  He is also agreeable starting Trazodone 50 mg nightly as needed to help manage sleep. Abilify increased to 10 mg to help manage mood.   1. Generalized anxiety disorder  Start- hydrOXYzine (ATARAX) 10 MG tablet; Take 1 tablet  (10 mg total) by mouth 3 (three) times daily as needed.  Dispense: 90 tablet; Refill: 3 Start- traZODone (DESYREL) 50 MG tablet; Take 1 tablet (50 mg total) by mouth at bedtime as needed for sleep.  Dispense: 30 tablet; Refill: 3  2. Bipolar I disorder, most recent episode depressed (HCC)  Start- ARIPiprazole (ABILIFY) 10 MG tablet; Take 1 tablet (10 mg total) by mouth daily.  Dispense: 30 tablet; Refill: 3 Start- traZODone (DESYREL) 50 MG tablet; Take 1 tablet (50  mg total) by mouth at bedtime as needed for sleep.  Dispense: 30 tablet; Refill: 3  Follow-up in 3 months   Shanna Cisco, NP 6/28/20248:48 AM

## 2023-04-30 ENCOUNTER — Telehealth (HOSPITAL_COMMUNITY): Payer: Self-pay | Admitting: Psychiatry

## 2023-05-01 ENCOUNTER — Other Ambulatory Visit (HOSPITAL_COMMUNITY): Payer: Self-pay | Admitting: Psychiatry

## 2023-05-01 DIAGNOSIS — F411 Generalized anxiety disorder: Secondary | ICD-10-CM

## 2023-05-01 DIAGNOSIS — F3130 Bipolar disorder, current episode depressed, mild or moderate severity, unspecified: Secondary | ICD-10-CM

## 2023-05-01 MED ORDER — HYDROXYZINE HCL 25 MG PO TABS: 25.0000 mg | ORAL_TABLET | Freq: Three times a day (TID) | ORAL | 3 refills | Status: AC | PRN

## 2023-05-01 MED ORDER — ARIPIPRAZOLE 15 MG PO TABS: 15.0000 mg | ORAL_TABLET | Freq: Every day | ORAL | 3 refills | Status: AC

## 2023-05-01 NOTE — Telephone Encounter (Signed)
Patient informed Clinical research associate that he has been more irritable, distracted, having racing thoughts.  He notes that he has been getting into a verbal altercation with family members.  Patient also informed Clinical research associate that he has been more anxious.  He denies SI/HI/AVH.  At times he notes that he is paranoid in social settings.  He informed Clinical research associate that he tries to isolate himself and stay indoors.  Today Abilify 10 mg increased to 15 mg to help manage mood.  Hydroxyzine 10 mg 3 times daily increased to 25 mg 3 times daily to help manage anxiety.  No other concerns noted at this time.

## 2023-05-09 ENCOUNTER — Telehealth (HOSPITAL_COMMUNITY): Payer: MEDICAID | Admitting: Psychiatry

## 2023-06-13 ENCOUNTER — Telehealth (HOSPITAL_COMMUNITY): Payer: MEDICAID | Admitting: Psychiatry

## 2023-06-28 ENCOUNTER — Other Ambulatory Visit (HOSPITAL_COMMUNITY): Payer: Self-pay | Admitting: Psychiatry

## 2023-06-28 DIAGNOSIS — F3130 Bipolar disorder, current episode depressed, mild or moderate severity, unspecified: Secondary | ICD-10-CM

## 2023-06-28 DIAGNOSIS — F411 Generalized anxiety disorder: Secondary | ICD-10-CM

## 2023-12-07 ENCOUNTER — Encounter: Payer: Self-pay | Admitting: Neurology

## 2023-12-07 ENCOUNTER — Institutional Professional Consult (permissible substitution): Payer: 59 | Admitting: Neurology

## 2024-03-13 ENCOUNTER — Ambulatory Visit: Payer: MEDICAID | Admitting: Neurology

## 2024-03-13 ENCOUNTER — Encounter: Payer: Self-pay | Admitting: Neurology

## 2024-03-13 ENCOUNTER — Telehealth: Payer: Self-pay

## 2024-03-13 VITALS — BP 111/79 | HR 78 | Ht 71.0 in | Wt 234.0 lb

## 2024-03-13 DIAGNOSIS — F4010 Social phobia, unspecified: Secondary | ICD-10-CM | POA: Diagnosis not present

## 2024-03-13 DIAGNOSIS — F3130 Bipolar disorder, current episode depressed, mild or moderate severity, unspecified: Secondary | ICD-10-CM | POA: Diagnosis not present

## 2024-03-13 DIAGNOSIS — F411 Generalized anxiety disorder: Secondary | ICD-10-CM | POA: Diagnosis not present

## 2024-03-13 DIAGNOSIS — G478 Other sleep disorders: Secondary | ICD-10-CM

## 2024-03-13 DIAGNOSIS — F99 Mental disorder, not otherwise specified: Secondary | ICD-10-CM

## 2024-03-13 DIAGNOSIS — F5105 Insomnia due to other mental disorder: Secondary | ICD-10-CM

## 2024-03-13 DIAGNOSIS — R0683 Snoring: Secondary | ICD-10-CM

## 2024-03-13 NOTE — Telephone Encounter (Signed)
 Patients mother called in today regarding his 10:00 appointment for a sleep consult. She stated that she got a phone call on Friday stating his appointment was at 9:15 and that he needed to check in early and that he was now being told his appointment was at 10. I was able to pull up his chart and did confirm the appointment time being 10 with a 9:30 check in. She advised me that she was always right and that our system was wrong. Looking at past appointments, I asked if it could have been an old reminder from his appointment in March that was no showed, she advised me that we're past that, his last appointment was cancelled because the provider wasn't there. I asked her how she wanted to proceed, because the only two things I could do were get him checked in for his appointment at 10 or rescheduled. At that point she disconnected the call.

## 2024-03-13 NOTE — Patient Instructions (Addendum)
 SSESSMENT AND PLAN:   28 y.o. year- old male  here with:    1) GAD:  a lifelong problem with anxiety and insomnia.  Had  been on trileptal .   2) vaping - needs to quit. (!!!) vapor is not healthier than smoke.   3) Sleep hygiene education, spoke about routines,  rules, out door activity.  Melatonin  metabolism,  cool, quiet and dark.  Turn the clock away.   4) Mood disorder- He had counseling for behavioral  problems in school when [age] and  he is open to counseling.  I plan to follow up either personally or through our NP within 3-5  months.   HST ordered today.  Insomnia Insomnia is a sleep disorder that makes it difficult to fall asleep or stay asleep. Insomnia can cause fatigue, low energy, difficulty concentrating, mood swings, and poor performance at work or school. There are three different ways to classify insomnia: Difficulty falling asleep. Difficulty staying asleep. Waking up too early in the morning. Any type of insomnia can be long-term (chronic) or short-term (acute). Both are common. Short-term insomnia usually lasts for 3 months or less. Chronic insomnia occurs at least three times a week for longer than 3 months. What are the causes? Insomnia may be caused by another condition, situation, or substance, such as: Having certain mental health conditions, such as anxiety and depression. Using caffeine, alcohol, tobacco, or drugs. Having gastrointestinal conditions, such as gastroesophageal reflux disease (GERD). Having certain medical conditions. These include: Asthma. Alzheimer's disease. Stroke. Chronic pain. An overactive thyroid  gland (hyperthyroidism). Other sleep disorders, such as restless legs syndrome and sleep apnea. Menopause. Sometimes, the cause of insomnia may not be known. What increases the risk? Risk factors for insomnia include: Gender. Females are affected more often than males. Age. Insomnia is more common as people get older. Stress and  certain medical and mental health conditions. Lack of exercise. Having an irregular work schedule. This may include working night shifts and traveling between different time zones. What are the signs or symptoms? If you have insomnia, the main symptom is having trouble falling asleep or having trouble staying asleep. This may lead to other symptoms, such as: Feeling tired or having low energy. Feeling nervous about going to sleep. Not feeling rested in the morning. Having trouble concentrating. Feeling irritable, anxious, or depressed. How is this diagnosed? This condition may be diagnosed based on: Your symptoms and medical history. Your health care provider may ask about: Your sleep habits. Any medical conditions you have. Your mental health. A physical exam. How is this treated? Treatment for insomnia depends on the cause. Treatment may focus on treating an underlying condition that is causing the insomnia. Treatment may also include: Medicines to help you sleep. Counseling or therapy. Lifestyle adjustments to help you sleep better. Follow these instructions at home: Eating and drinking  Limit or avoid alcohol, caffeinated beverages, and products that contain nicotine and tobacco, especially close to bedtime. These can disrupt your sleep. Do not eat a large meal or eat spicy foods right before bedtime. This can lead to digestive discomfort that can make it hard for you to sleep. Sleep habits  Keep a sleep diary to help you and your health care provider figure out what could be causing your insomnia. Write down: When you sleep. When you wake up during the night. How well you sleep and how rested you feel the next day. Any side effects of medicines you are taking. What you eat  and drink. Make your bedroom a dark, comfortable place where it is easy to fall asleep. Put up shades or blackout curtains to block light from outside. Use a white noise machine to block noise. Keep the  temperature cool. Limit screen use before bedtime. This includes: Not watching TV. Not using your smartphone, tablet, or computer. Stick to a routine that includes going to bed and waking up at the same times every day and night. This can help you fall asleep faster. Consider making a quiet activity, such as reading, part of your nighttime routine. Try to avoid taking naps during the day so that you sleep better at night. Get out of bed if you are still awake after 15 minutes of trying to sleep. Keep the lights down, but try reading or doing a quiet activity. When you feel sleepy, go back to bed. General instructions Take over-the-counter and prescription medicines only as told by your health care provider. Exercise regularly as told by your health care provider. However, avoid exercising in the hours right before bedtime. Use relaxation techniques to manage stress. Ask your health care provider to suggest some techniques that may work well for you. These may include: Breathing exercises. Routines to release muscle tension. Visualizing peaceful scenes. Make sure that you drive carefully. Do not drive if you feel very sleepy. Keep all follow-up visits. This is important. Contact a health care provider if: You are tired throughout the day. You have trouble in your daily routine due to sleepiness. You continue to have sleep problems, or your sleep problems get worse. Get help right away if: You have thoughts about hurting yourself or someone else. Get help right away if you feel like you may hurt yourself or others, or have thoughts about taking your own life. Go to your nearest emergency room or: Call 911. Call the National Suicide Prevention Lifeline at 586-243-4103 or 988. This is open 24 hours a day. Text the Crisis Text Line at 2500454420. Summary Insomnia is a sleep disorder that makes it difficult to fall asleep or stay asleep. Insomnia can be long-term (chronic) or short-term  (acute). Treatment for insomnia depends on the cause. Treatment may focus on treating an underlying condition that is causing the insomnia. Keep a sleep diary to help you and your health care provider figure out what could be causing your insomnia. This information is not intended to replace advice given to you by your health care provider. Make sure you discuss any questions you have with your health care provider. Document Revised: 08/29/2021 Document Reviewed: 08/29/2021 Elsevier Patient Education  2024 [employer].

## 2024-03-13 NOTE — Progress Notes (Signed)
 SLEEP MED[facility]INE CLINIC    Provider:  Neomia Banner, MD  Primary Care Physician:  Berneda Bridges, MD [address] [place] [address]     Referring Provider: Berneda Bridges, Md [address] [place],  [place] [address]          Chief Complaint according to patient   Patient presents with:     New Patient (Initial Visit)           HISTORY OF PRESENT ILLNESS:  Mitchell Wilson is a 28 y.o. male patient who is seen upon referral on 03/13/2024 from PCP for a sleep consultation. .  Chief concern according to patient :   I have not felt refreshed  in AM for a long time, and I could  always need another hour of sleep. I wake up 7-8 AM   without alarm,  and I had problems as a teen to get a routine sleep schedule, was a night owl.  He fell asleep in school.Going to bed at 3 AM and just sleep not at all.  There was a cyclic pattern at the time-  sleep as improved  since then, he exercises outside.    He lives with both parents, his father is blind and he is supporting them- reports he has been snoring and has a increase in daytime fatigue as the day goes on.  Irritable - He gets about 8 hrs of sleep a night. He has no trouble with falling or staying asleep. He is fatigued regardless of how many hrs of sleep he gets.      I have the pleasure of seeing Mitchell Wilson 03/13/24 a right-handed male with a possible sleep disorder.      Sleep relevant medical history: Nocturia  rare  , childhood Sleep walking, no ENT surgery, no TBI, no DDD.     Family medical /sleep history: No other family member on CPAP with OSA, insomnia, sleep walkers.    Social history:  Patient is not working  and expects a Tree surgeon hearing, lives in a household with  persons, no pets.  Graduated -  dropped out HS 11 th grade ,  absenteeism. Anxiety.   Family status is single.  Tobacco use; smokes infrequently, vaping -  .  ETOH use ; none ,  Caffeine intake in form of Coffee( /) Soda( 0-2 a  day) Tea ( at dinner ) , rarely energy drinks      Sleep habits are as follows: The patient's dinner time is between 3-4 PM.  Later snacks. The patient goes to bed at 12.30 AM and continues to sleep for 4-8 hours, The preferred sleep position is laterally, with the support of 1-2 pillows.  Dreams are reportedly rare/ frequent/vivid.   The patient wakes up spontaneously 7-8, 8  AM is the usual rise time. He reports not feeling refreshed or restored in AM, with symptoms such as dry mouth, no morning headaches, and residual fatigue.  Naps are taken infrequently, lasting from 30 to 120 minutes - and sleeps while his father sleeps, refreshing than nocturnal sleep.    Review of Systems: Out of a complete 14 system review, the patient complains of only the following symptoms, and all other reviewed systems are negative.:  Fatigue, sleepiness , snoring, fragmented sleep, Insomnia,   How likely are you to doze in the following situations: 0 = not likely, 1 = slight chance, 2 = moderate chance, 3 = high chance  Sitting and Reading? Watching Television? Sitting inactive in a public place (theater or meeting)? As a passenger in a car for an hour without a break? Lying down in the afternoon when circumstances permit? Sitting and talking to someone? Sitting quietly after lunch without alcohol? In a car, while stopped for a few minutes in traffic?   Total = 10/ 24 points   FSS endorsed at 39/ 63 points.   Social anxiety -  since childhood.    Social History   Socioeconomic History   Marital status: Single    Spouse name: Not on file   Number of children: Not on file   Years of education: Not on file   Highest education level: Not on file  Occupational History   Not on file  Tobacco Use   Smoking status: Some Days    Types: Cigarettes   Smokeless tobacco: Not on file  Vaping Use   Vaping status: Every Day  Substance and Sexual Activity   Alcohol use: No   Drug use: No   Sexual  activity: Yes  Other Topics Concern   Not on file  Social History Narrative   Not on file   Social Drivers of Health   Financial Resource Strain: Not on file  Food Insecurity: Not on file  Transportation Needs: Not on file  Physical Activity: Not on file  Stress: Not on file  Social Connections: Not on file    History reviewed. No pertinent family history.  Past Medical History:  Diagnosis Date   Asthma    Scoliosis     Past Surgical History:  Procedure Laterality Date   HERNIA REPAIR       Current Outpatient Medications on File Prior to Visit  Medication Sig Dispense Refill   acetaminophen  (TYLENOL ) 325 MG tablet Take 650 mg by mouth every 6 (six) hours as needed.       albuterol  (PROVENTIL  HFA;VENTOLIN  HFA) 108 (90 BASE) MCG/ACT inhaler Inhale 1 puff into the lungs every 6 (six) hours as needed for wheezing or shortness of breath.     ALBUTEROL  IN Inhale into the lungs.       ARIPiprazole  (ABILIFY ) 15 MG tablet Take 1 tablet (15 mg total) by mouth daily. 30 tablet 3   beclomethasone (QVAR) 80 MCG/ACT inhaler Inhale 1 puff into the lungs as needed.       buPROPion  (WELLBUTRIN  XL) 150 MG 24 hr tablet Take 1 tablet (150 mg total) by mouth every morning. 30 tablet 2   hydrOXYzine  (ATARAX ) 25 MG tablet Take 1 tablet (25 mg total) by mouth 3 (three) times daily as needed. 90 tablet 3   loratadine  (CLARITIN ) 10 MG tablet Take 1 tablet (10 mg total) by mouth daily. 14 tablet 0   naproxen  (NAPROSYN ) 500 MG tablet Take 1 tablet (500 mg total) by mouth 2 (two) times daily. 30 tablet 0   OXcarbazepine  (TRILEPTAL ) 150 MG tablet Take 1 tablet (150 mg total) by mouth 2 (two) times daily. 60 tablet 2   traZODone  (DESYREL ) 50 MG tablet Take 1 tablet (50 mg total) by mouth at bedtime as needed for sleep. 30 tablet 3   triamcinolone  (NASACORT  AQ) 55 MCG/ACT AERO nasal inhaler Place 2 sprays into the nose daily. 1 Inhaler 12   buPROPion  (WELLBUTRIN  XL) 150 MG 24 hr tablet TAKE 1 TABLET (150  MG TOTAL) BY MOUTH EVERY MORNING. (Patient not taking: Reported on 03/13/2024) 30 tablet 2   OXcarbazepine  (TRILEPTAL ) 150 MG tablet TAKE 1 TABLET (150  MG TOTAL) BY MOUTH 2 (TWO) TIMES DAILY. (Patient not taking: Reported on 03/13/2024) 60 tablet 2   No current facility-administered medications on file prior to visit.    No Known Allergies   DIAGNOSTIC DATA (LABS, IMAGING, TESTING) - I reviewed patient records, labs, notes, testing and imaging myself where available.  Lab Results  Component Value Date   WBC 5.7 11/22/2020   HGB 13.8 11/22/2020   HCT 43.6 11/22/2020   MCV 85.3 11/22/2020   PLT 212 11/22/2020      Component Value Date/Time   NA 140 11/22/2020 1031   K 4.0 11/22/2020 1031   CL 102 11/22/2020 1031   CO2 27 11/22/2020 1031   GLUCOSE 92 11/22/2020 1031   BUN 7 11/22/2020 1031   CREATININE 1.05 11/22/2020 1031   CALCIUM 9.6 11/22/2020 1031   PROT 7.7 11/22/2020 1031   ALBUMIN 4.2 11/22/2020 1031   AST 24 11/22/2020 1031   ALT 19 11/22/2020 1031   ALKPHOS 65 11/22/2020 1031   BILITOT 0.8 11/22/2020 1031   GFRNONAA >60 11/22/2020 1031   Lab Results  Component Value Date   CHOL 205 (H) 11/22/2020   HDL 38 (L) 11/22/2020   LDLCALC 153 (H) 11/22/2020   TRIG 71 11/22/2020   CHOLHDL 5.4 11/22/2020   Lab Results  Component Value Date   HGBA1C 5.8 (H) 11/22/2020   No results found for: VITAMINB12 Lab Results  Component Value Date   TSH 1.382 11/22/2020    PHYSICAL EXAM:  Today's Vitals   03/13/24 1011  BP: 111/79  Pulse: 78  Weight: 234 lb (106.1 kg)  Height: 5' 11 (1.803 m)   Body mass index is 32.64 kg/m.   Wt Readings from Last 3 Encounters:  03/13/24 234 lb (106.1 kg)  03/26/15 221 lb (100.2 kg) (98%, Z= 2.01)*   * Growth percentiles are based on CDC (Boys, 2-20 Years) data.     Ht Readings from Last 3 Encounters:  03/13/24 5' 11 (1.803 m)  03/26/15 5' 11 (1.803 m) (72%, Z= 0.58)*   * Growth percentiles are based on CDC (Boys, 2-20  Years) data.      General: The patient is awake, alert and appears not in acute distress.  The patient is well groomed. Head: Normocephalic, atraumatic. Neck is supple. Mallampati 3,  neck circumference:18 inches . Nasal airflow fully patent.   Retrognathia is not seen.  Dental status:  wore retainer for irregular teeth.  Cardiovascular:  Regular rate and cardiac rhythm by pulse,  without distended neck veins. Respiratory: Lungs are clear to auscultation.  Skin:  Without evidence of ankle edema, or rash. Trunk: The patient's posture is erect.   NEUROLOGIC EXAM: The patient is awake and alert, oriented to place and time.   Memory subjective described as intact.  Attention span & concentration ability appears normal.  Speech is fluent,  without  dysarthria, dysphonia or aphasia.  Mood and affect are appropriate.   Cranial nerves: no loss of smell or taste reported  Pupils are equal and briskly reactive to light.  Funduscopic exam def..  Extraocular movements in vertical and horizontal planes were intact and without nystagmus. No Diplopia. Visual fields by finger perimetry are intact. Hearing was intact to soft voice and finger rubbing.    Facial sensation intact to fine touch.  Facial motor strength is symmetric and tongue and uvula move midline.  Neck ROM : rotation, tilt and flexion extension were normal for age and shoulder shrug was symmetrical.  Motor exam:  Symmetric bulk, tone and ROM.   Normal tone without cog wheeling, symmetric grip strength .  Sensory:  Fine touch, pinprick and vibration were tested  and  normal.  Proprioception tested in the upper extremities was normal. Coordination: Rapid alternating movements in the fingers/hands were of normal speed.  The Finger-to-nose maneuver was intact without evidence of ataxia, dysmetria or tremor. Gait and station: Patient could rise unassisted from a seated position, walked without assistive device.  Stance is of normal  width/ base.   Deep tendon reflexes: in the  upper and lower extremities are symmetric and intact.    ASSESSMENT AND PLAN:   28 y.o. year- old male  here with:    1) GAD:  a lifelong problem with anxiety and insomnia.  Had  been on trileptal .   2) vaping - needs to quit. (!!!) vapor is not healthier than smoke.   3) Sleep hygiene education, spoke about routines,  rules, out door activity.  Melatonin  metabolism,  cool, quiet and dark.  Turn the clock away.   4) Mood disorder- He had counseling for behavioral  problems in school when [age] and  he is open to counseling.  I plan to follow up either personally or through our NP within 3-5  months.   HST ordered today.   I would like to thank Berneda Bridges, MD and Fulp, Cammie, Md [address] [place],  [place] [address] for allowing me to meet with and to take care of this pleasant patient.    After spending a total time of  35  minutes face to face and additional time for physical and neurologic examination, review of laboratory studies,  personal review of imaging studies, reports and results of other testing and review of referral information / records as far as provided in visit,   Electronically signed by: Neomia Banner, MD 03/13/2024 10:44 AM  [facility] Board certified by The [employer] of Sleep Medicine and Diplomate of the [employer] of Sleep Medicine. Board certified In Neurology through the ABPN, Fellow of the [employer] of Neu[employer].

## 2024-03-19 ENCOUNTER — Institutional Professional Consult (permissible substitution): Admitting: Neurology

## 2024-03-25 ENCOUNTER — Telehealth: Payer: Self-pay | Admitting: Neurology

## 2024-03-25 NOTE — Telephone Encounter (Signed)
 Pt  and mother called to inform Md that Pt has not received CPAP Machine , Pt   and mother would like to know what's going on

## 2024-03-26 NOTE — Telephone Encounter (Signed)
 HST was ordered (not a machine) and the sleep lab has been in touch with pt since this call was received.

## 2024-03-28 ENCOUNTER — Ambulatory Visit: Payer: MEDICAID

## 2024-03-28 DIAGNOSIS — F411 Generalized anxiety disorder: Secondary | ICD-10-CM

## 2024-03-28 DIAGNOSIS — F4010 Social phobia, unspecified: Secondary | ICD-10-CM

## 2024-03-28 DIAGNOSIS — F5105 Insomnia due to other mental disorder: Secondary | ICD-10-CM

## 2024-03-28 DIAGNOSIS — G478 Other sleep disorders: Secondary | ICD-10-CM

## 2024-03-28 DIAGNOSIS — F3130 Bipolar disorder, current episode depressed, mild or moderate severity, unspecified: Secondary | ICD-10-CM

## 2024-03-28 DIAGNOSIS — R0683 Snoring: Secondary | ICD-10-CM
